# Patient Record
Sex: Male | Born: 1995 | Race: White | Hispanic: No | Marital: Married | Smoking: Never smoker
Health system: Southern US, Community
[De-identification: ages and names within clinical notes are randomized; demographics above are authoritative.]

## PROBLEM LIST (undated history)

## (undated) DIAGNOSIS — R42 Dizziness and giddiness: Secondary | ICD-10-CM

---
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
## 2017-06-05 IMAGING — CT CT ABD-PELV W/ CM
2 of 4 series · 16 of 46 positions shown, 18 images · IV contrast (ISOVUE 300)
Comparison: None.

CLINICAL DATA: 20-year-old male with nausea vomiting and left lower
quadrant abdominal pain.

EXAM:
CT ABDOMEN AND PELVIS WITH CONTRAST
TECHNIQUE: Multidetector CT imaging of the abdomen and pelvis was performed
using the standard protocol following bolus administration of
intravenous contrast.
CONTRAST:  100mL [id number] IOPAMIDOL ([id number]) INJECTION 61%

[Series 2: abd/pel with · axial · 0.69mm/px · z∈[-351,+84]mm · 13 of 95 slices shown, 15 images]
[im 4/95  soft-tissue]
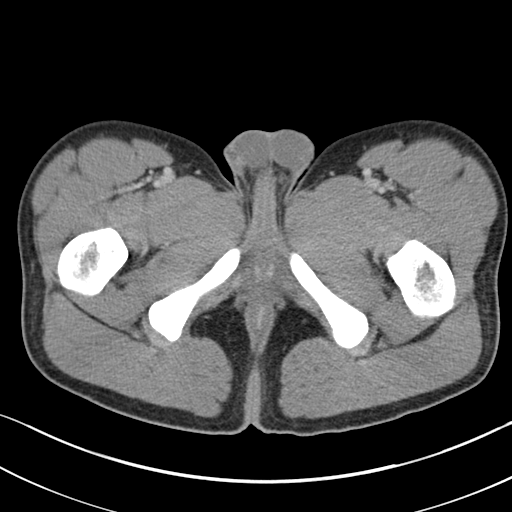
[im 4/95  bone]
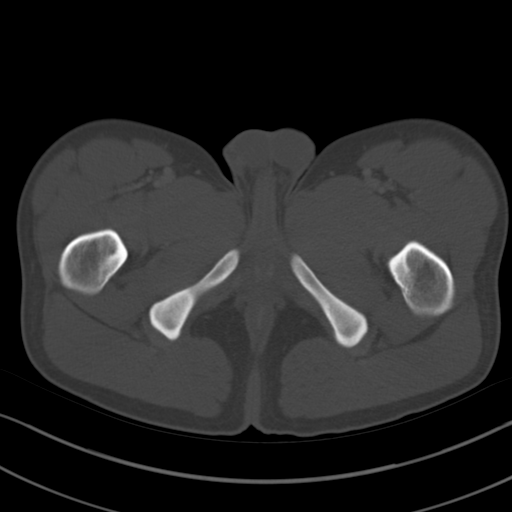
[im 12/95  soft-tissue]
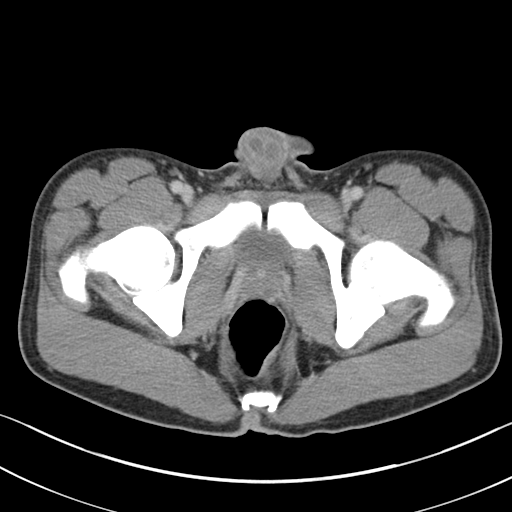
[im 19/95  soft-tissue]
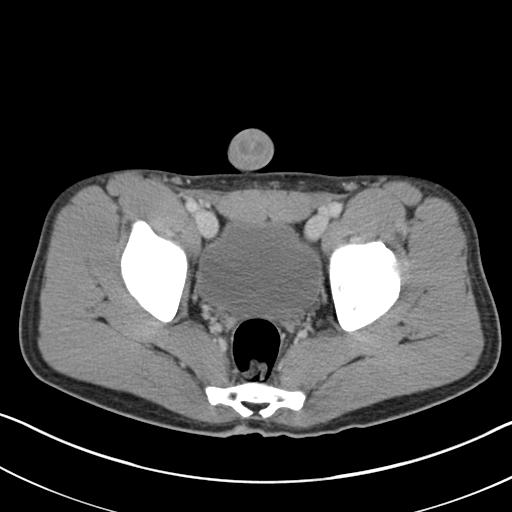
[im 27/95  soft-tissue]
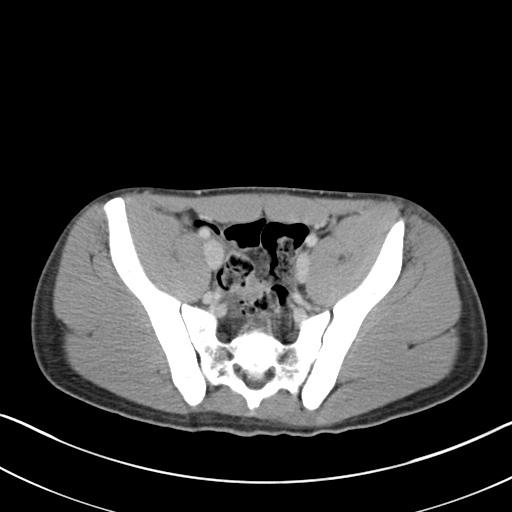
[im 34/95  soft-tissue]
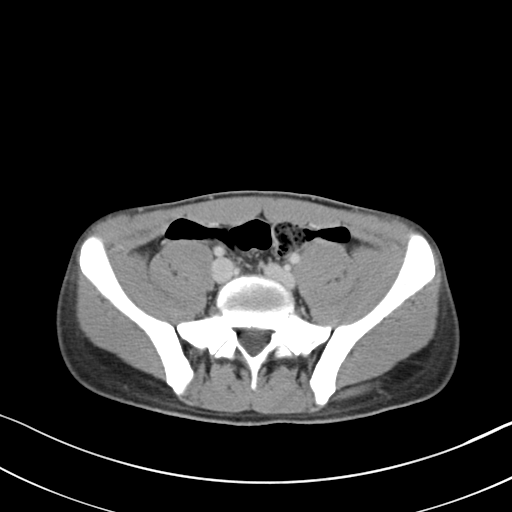
[im 42/95  soft-tissue]
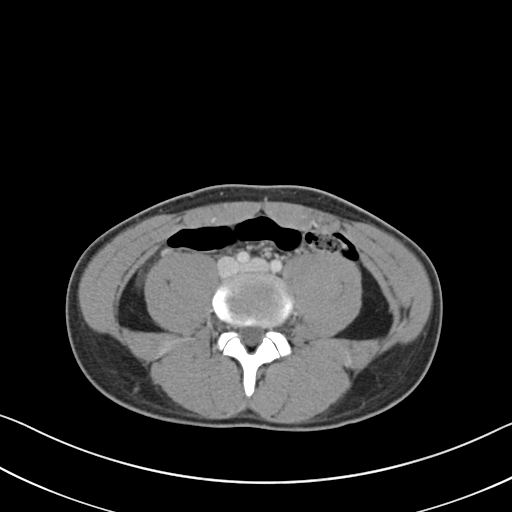
[im 49/95  soft-tissue]
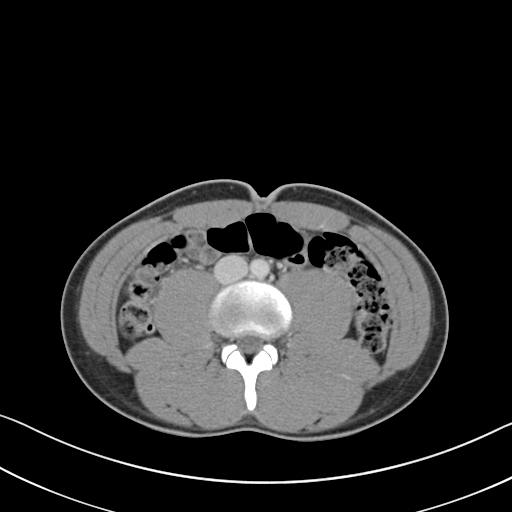
[im 53/95  soft-tissue]
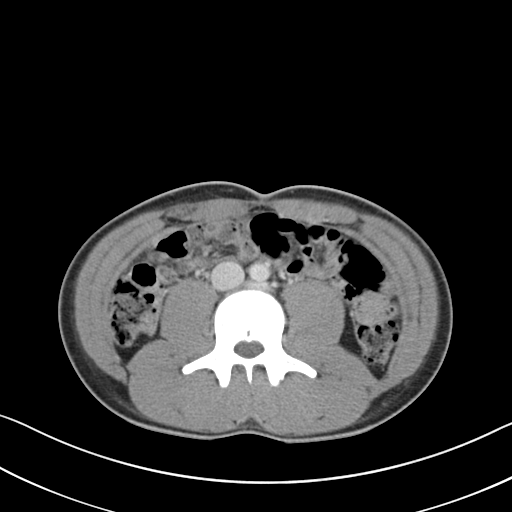
[im 61/95  soft-tissue]
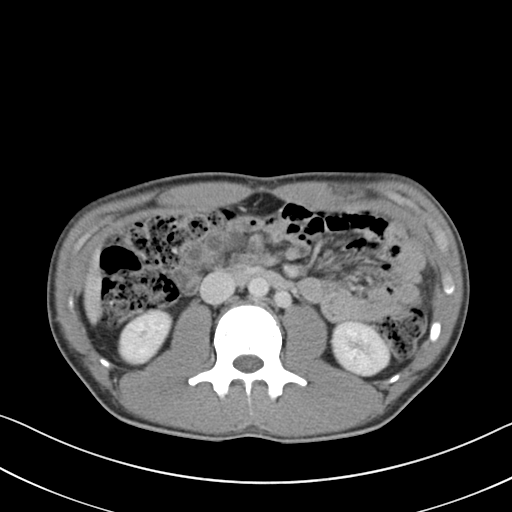
[im 61/95  bone]
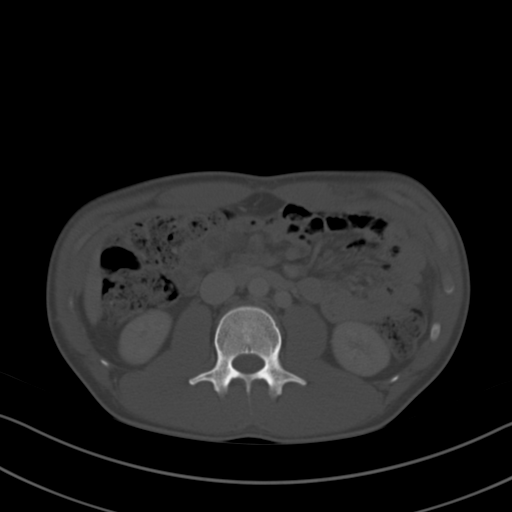
[im 68/95  soft-tissue]
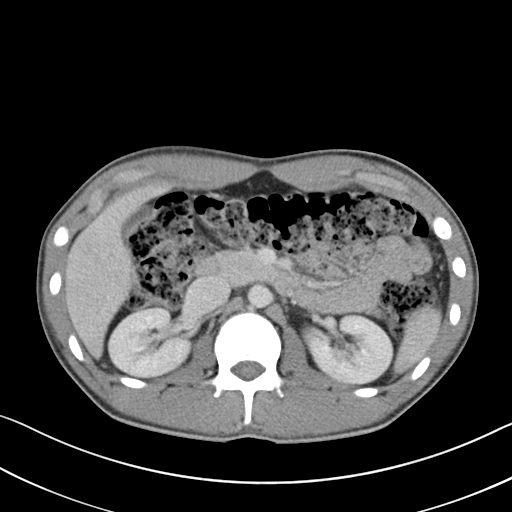
[im 76/95  soft-tissue]
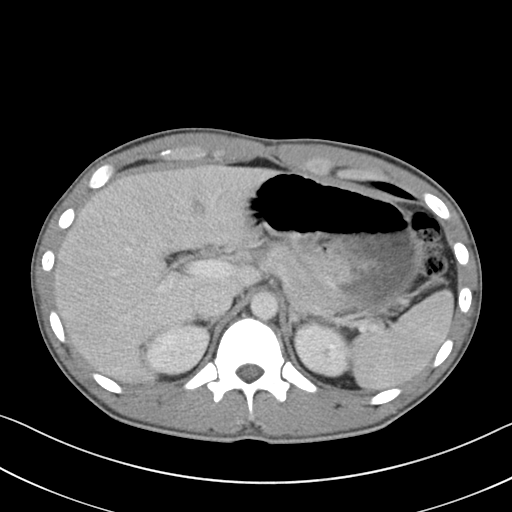
[im 83/95  soft-tissue]
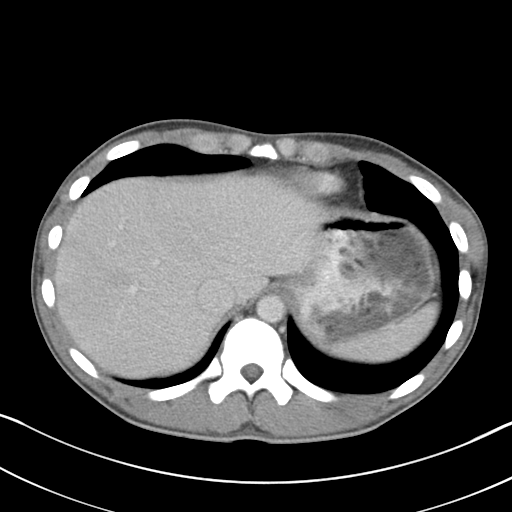
[im 91/95  soft-tissue]
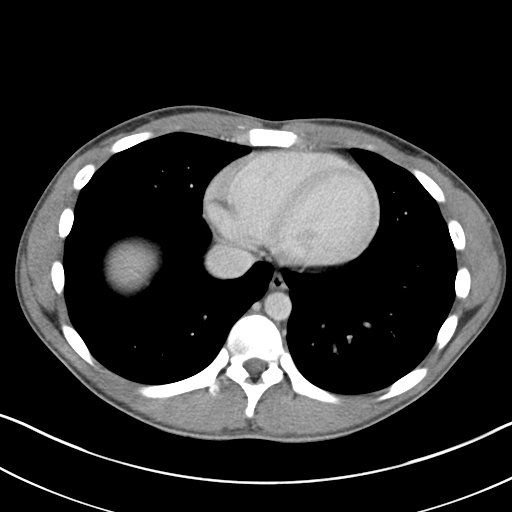

[Series 4: coronal a/|p · coronal · 0.63mm/px · 3 of 71 slices shown]
[im 24/71  soft-tissue]
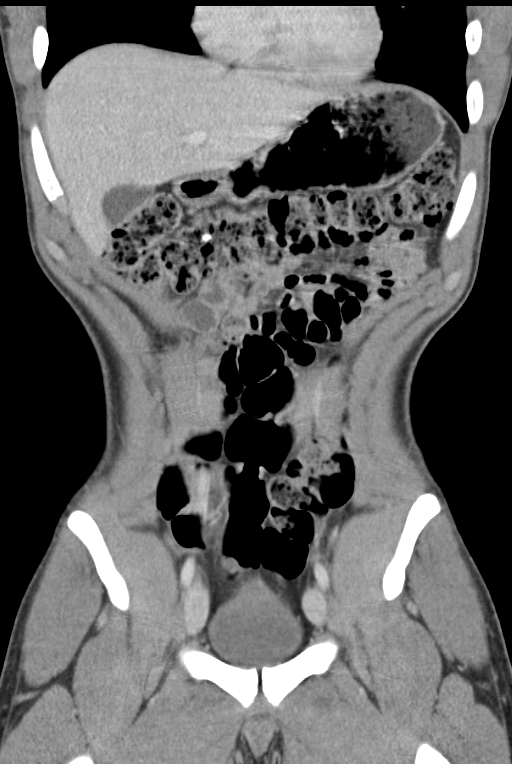
[im 32/71  soft-tissue]
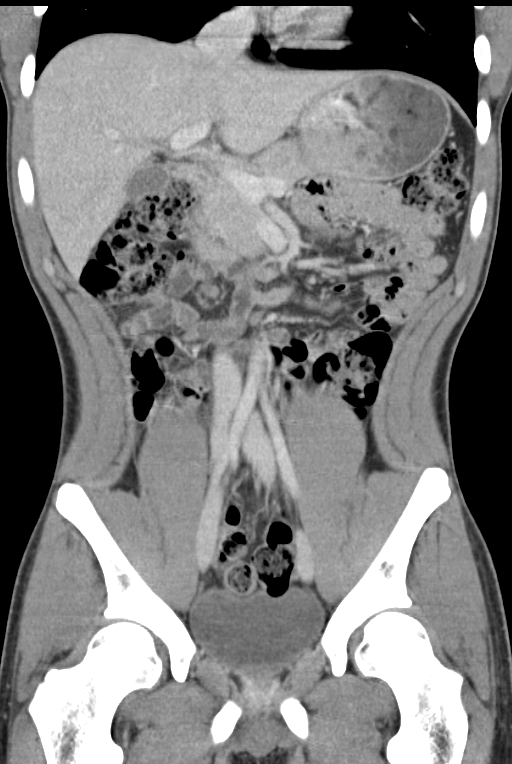
[im 39/71  soft-tissue]
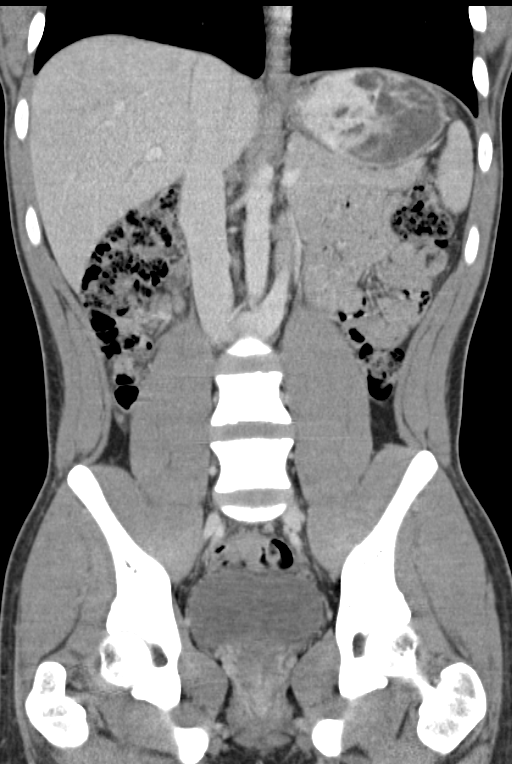

[16 of 46 positions shown; findings below may reference images not displayed]

FINDINGS: The visualized lung bases are clear. No intra-abdominal free air.
Trace free fluid within pelvis.

The liver, gallbladder, pancreas, spleen, adrenal glands, kidneys,
visualized ureters, and urinary bladder appear unremarkable. The
prostate appears grossly unremarkable.

There is moderate stool throughout the colon. No evidence of bowel
obstruction. Multiple normal caliber fluid containing loops of small
bowel noted which may be physiologic. There is however mild diffuse
engorgement of the mesenteric vasculature with stranding of the
mesentery. Clinical correlation is recommended to evaluate for
enteritis. Normal appendix.

The abdominal aorta and IVC appear unremarkable. No portal venous
gas identified. There is no adenopathy. The abdominal wall soft
tissues appear unremarkable. The osseous structures are intact.
IMPRESSION: Findings concerning for enteritis. Clinical correlation is
recommended.

No evidence of bowel obstruction.  Normal appendix.

## 2023-10-22 ENCOUNTER — Other Ambulatory Visit: Payer: Self-pay

## 2023-10-22 ENCOUNTER — Emergency Department (HOSPITAL_COMMUNITY)
Admission: EM | Admit: 2023-10-22 | Discharge: 2023-10-22 | Disposition: A | Discharge status: Home / Self Care | Attending: Student | Admitting: Student

## 2023-10-22 ENCOUNTER — Emergency Department (HOSPITAL_COMMUNITY)

## 2023-10-22 ENCOUNTER — Encounter (HOSPITAL_COMMUNITY): Payer: Self-pay | Admitting: Emergency Medicine

## 2023-10-22 DIAGNOSIS — M545 Low back pain, unspecified: Secondary | ICD-10-CM | POA: Insufficient documentation

## 2023-10-22 DIAGNOSIS — Y92410 Unspecified street and highway as the place of occurrence of the external cause: Secondary | ICD-10-CM | POA: Insufficient documentation

## 2023-10-22 MED ORDER — METHOCARBAMOL 500 MG PO TABS: 500.0000 mg | ORAL_TABLET | Freq: Two times a day (BID) | ORAL | 0 refills | Status: AC

## 2023-10-22 MED ORDER — NAPROXEN 500 MG PO TABS: 500.0000 mg | ORAL_TABLET | Freq: Two times a day (BID) | ORAL | 0 refills | Status: AC

## 2023-10-22 MED ORDER — CYCLOBENZAPRINE HCL 10 MG PO TABS
5.0000 mg | ORAL_TABLET | Freq: Once | ORAL | Status: AC
  Administered 2023-10-22: 5 mg via ORAL
  Filled 2023-10-22: qty 1

## 2023-10-22 NOTE — Discharge Instructions (Addendum)
I have prescribed muscle relaxers for your pain, please do not drink or drive while taking this medications as it can make you drowsy.     Please follow-up with PCP in 1 week for reevaluation of your symptoms.If you experience any bowel or bladder incontinence, fever, worsening in your symptoms please return to the ED.  
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
## 2023-10-27 ENCOUNTER — Ambulatory Visit
Admission: RE | Admit: 2023-10-27 | Discharge: 2023-10-27 | Disposition: A | Discharge status: Home / Self Care | Payer: Self-pay | Source: Ambulatory Visit | Attending: Family Medicine | Admitting: Family Medicine

## 2023-10-27 ENCOUNTER — Other Ambulatory Visit: Payer: Self-pay

## 2023-10-27 VITALS — BP 118/72 | HR 70 | Temp 97.6°F | Resp 17

## 2023-10-27 DIAGNOSIS — M5442 Lumbago with sciatica, left side: Secondary | ICD-10-CM

## 2023-10-27 DIAGNOSIS — M5441 Lumbago with sciatica, right side: Secondary | ICD-10-CM

## 2023-10-27 MED ORDER — METHYLPREDNISOLONE ACETATE 80 MG/ML IJ SUSP
60.0000 mg | Freq: Once | INTRAMUSCULAR | Status: AC
  Administered 2023-10-27: 60 mg via INTRAMUSCULAR

## 2023-10-27 MED ORDER — PREDNISONE 10 MG (21) PO TBPK: ORAL_TABLET | Freq: Every day | ORAL | 0 refills | Status: AC
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
## 2023-10-27 NOTE — Discharge Instructions (Addendum)
 You may continue the naproxen  and the muscle relaxer as previously prescribed to you by the emergency room.  You were given a steroid shot in the clinic today for your sciatica pain.  You may start the prednisone taper tomorrow, 6/11.  Use heat to the low back as needed.  Lots of rest.  Please follow-up with your PCP if your symptoms do not improve.  Please go to the ER for any worsening symptoms.  Hope you feel better soon!
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
## 2023-11-09 ENCOUNTER — Other Ambulatory Visit: Payer: Self-pay | Admitting: Surgery

## 2023-11-09 DIAGNOSIS — M545 Low back pain, unspecified: Secondary | ICD-10-CM

## 2023-11-13 ENCOUNTER — Other Ambulatory Visit

## 2023-11-16 ENCOUNTER — Ambulatory Visit
Admission: RE | Admit: 2023-11-16 | Discharge: 2023-11-16 | Disposition: A | Discharge status: Home / Self Care | Payer: Self-pay | Source: Ambulatory Visit | Attending: Surgery | Admitting: Surgery

## 2023-11-16 DIAGNOSIS — M545 Low back pain, unspecified: Secondary | ICD-10-CM
